# Patient Record
Sex: Male | Born: 1980
Health system: Southern US, Community
[De-identification: ages and names within clinical notes are randomized; demographics above are authoritative.]

## PROBLEM LIST (undated history)

## (undated) HISTORY — PX: TONSILLECTOMY: SUR1361

---

## 2000-05-02 ENCOUNTER — Encounter: Payer: Self-pay | Admitting: *Deleted

## 2000-05-02 ENCOUNTER — Emergency Department (HOSPITAL_COMMUNITY): Admission: EM | Admit: 2000-05-02 | Discharge: 2000-05-02 | Payer: Self-pay | Admitting: Emergency Medicine

## 2001-12-30 ENCOUNTER — Encounter: Payer: Self-pay | Admitting: Emergency Medicine

## 2001-12-30 ENCOUNTER — Emergency Department (HOSPITAL_COMMUNITY): Admission: EM | Admit: 2001-12-30 | Discharge: 2001-12-30 | Payer: Self-pay | Admitting: Emergency Medicine

## 2010-07-29 ENCOUNTER — Emergency Department (HOSPITAL_BASED_OUTPATIENT_CLINIC_OR_DEPARTMENT_OTHER): Admission: EM | Admit: 2010-07-29 | Discharge: 2010-07-29 | Payer: Self-pay | Admitting: Emergency Medicine

## 2012-06-23 ENCOUNTER — Encounter (HOSPITAL_BASED_OUTPATIENT_CLINIC_OR_DEPARTMENT_OTHER): Payer: Self-pay

## 2012-06-23 ENCOUNTER — Emergency Department (HOSPITAL_BASED_OUTPATIENT_CLINIC_OR_DEPARTMENT_OTHER)
Admission: EM | Admit: 2012-06-23 | Discharge: 2012-06-23 | Disposition: A | Discharge status: Home / Self Care | Payer: Self-pay | Attending: Emergency Medicine | Admitting: Emergency Medicine

## 2012-06-23 DIAGNOSIS — R05 Cough: Secondary | ICD-10-CM

## 2012-06-23 DIAGNOSIS — R059 Cough, unspecified: Secondary | ICD-10-CM | POA: Insufficient documentation

## 2012-06-23 DIAGNOSIS — L259 Unspecified contact dermatitis, unspecified cause: Secondary | ICD-10-CM | POA: Insufficient documentation

## 2012-06-23 DIAGNOSIS — Z91038 Other insect allergy status: Secondary | ICD-10-CM | POA: Insufficient documentation

## 2012-06-23 DIAGNOSIS — L309 Dermatitis, unspecified: Secondary | ICD-10-CM

## 2012-06-23 DIAGNOSIS — L0100 Impetigo, unspecified: Secondary | ICD-10-CM | POA: Insufficient documentation

## 2012-06-23 DIAGNOSIS — Z880 Allergy status to penicillin: Secondary | ICD-10-CM | POA: Insufficient documentation

## 2012-06-23 MED ORDER — BENZONATATE 100 MG PO CAPS: 100.0000 mg | ORAL_CAPSULE | Freq: Three times a day (TID) | ORAL | Status: AC

## 2012-06-23 MED ORDER — MUPIROCIN CALCIUM 2 % EX CREA: TOPICAL_CREAM | Freq: Three times a day (TID) | CUTANEOUS | Status: AC

## 2012-06-23 MED ORDER — HYDROCORTISONE 1 % EX CREA: TOPICAL_CREAM | CUTANEOUS | Status: AC
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
## 2012-06-27 NOTE — ED Provider Notes (Signed)
Medical screening examination/treatment/procedure(s) were performed by non-physician practitioner and as supervising physician I was immediately available for consultation/collaboration.   Demarco Bacci B. Bernette Mayers, MD 06/27/12 [phone]

## 2013-07-23 ENCOUNTER — Encounter (HOSPITAL_BASED_OUTPATIENT_CLINIC_OR_DEPARTMENT_OTHER): Payer: Self-pay | Admitting: Emergency Medicine

## 2013-07-23 ENCOUNTER — Emergency Department (HOSPITAL_BASED_OUTPATIENT_CLINIC_OR_DEPARTMENT_OTHER): Payer: Self-pay

## 2013-07-23 ENCOUNTER — Emergency Department (HOSPITAL_BASED_OUTPATIENT_CLINIC_OR_DEPARTMENT_OTHER)
Admission: EM | Admit: 2013-07-23 | Discharge: 2013-07-23 | Disposition: A | Discharge status: Home / Self Care | Payer: Self-pay | Attending: Emergency Medicine | Admitting: Emergency Medicine

## 2013-07-23 DIAGNOSIS — S6990XA Unspecified injury of unspecified wrist, hand and finger(s), initial encounter: Secondary | ICD-10-CM | POA: Insufficient documentation

## 2013-07-23 DIAGNOSIS — IMO0002 Reserved for concepts with insufficient information to code with codable children: Secondary | ICD-10-CM | POA: Insufficient documentation

## 2013-07-23 DIAGNOSIS — Y9389 Activity, other specified: Secondary | ICD-10-CM | POA: Insufficient documentation

## 2013-07-23 DIAGNOSIS — Z880 Allergy status to penicillin: Secondary | ICD-10-CM | POA: Insufficient documentation

## 2013-07-23 DIAGNOSIS — Y9289 Other specified places as the place of occurrence of the external cause: Secondary | ICD-10-CM | POA: Insufficient documentation

## 2013-07-23 DIAGNOSIS — S6980XA Other specified injuries of unspecified wrist, hand and finger(s), initial encounter: Secondary | ICD-10-CM | POA: Insufficient documentation

## 2013-07-23 DIAGNOSIS — S6991XA Unspecified injury of right wrist, hand and finger(s), initial encounter: Secondary | ICD-10-CM

## 2013-07-23 DIAGNOSIS — X58XXXA Exposure to other specified factors, initial encounter: Secondary | ICD-10-CM | POA: Insufficient documentation

## 2013-07-23 NOTE — ED Notes (Signed)
Pt was on a deck when boards broke and fell through x 2 days ago. Pt c/o pain and swelling in right wrist.

## 2013-07-23 NOTE — ED Provider Notes (Signed)
CSN: [id number]     Arrival date & time 07/23/13  2010 History  This chart was scribed for Candyce Churn, MD by Karle Plumber, ED Scribe. This patient was seen in room MH03/MH03 and the patient's care was started at 10:00 PM.    Chief Complaint  Patient presents with  . Arm Injury   Patient is a 32 y.o. male presenting with arm injury. The history is provided by the patient. No language interpreter was used.  Arm Injury Location:  Wrist Time since incident:  2 days Wrist location:  R wrist  HPI Comments:  Rodney Hunter is a 32 y.o. male who presents to the Emergency Department complaining of a right wrist injury after falling about three feet through the boards of a deck three days ago. Pt states he has a sharp shooting pain from the base of his right thumb up into his arm. He states he has taken hydrocodone for pain at night with moderate relief and 3 Ibuprofen approximately twice during the day. Pt denies h/o all other pertinent past medical history. He states he does not have a primary care physician.   History reviewed. No pertinent past medical history. Past Surgical History  Procedure Laterality Date  . Tonsillectomy     No family history on file. History  Substance Use Topics  . Smoking status: Never Smoker   . Smokeless tobacco: Not on file  . Alcohol Use: Yes    Review of Systems  All other systems reviewed and are negative.    Allergies  Bee venom and Penicillins  Home Medications  No current outpatient prescriptions on file. Triage Vitals: BP 117/65  Pulse 64  Temp(Src) 98.2 F (36.8 C) (Oral)  Resp 16  Ht 5\' 6"  (1.676 m)  Wt 148 lb (67.132 kg)  BMI 23.9 kg/m2  SpO2 99% Physical Exam  Nursing note and vitals reviewed. Constitutional: He is oriented to person, place, and time. He appears well-developed and well-nourished. No distress.  HENT:  Head: Normocephalic and atraumatic.  Eyes: Conjunctivae are normal. No scleral icterus.  Neck: Neck  supple.  Cardiovascular: Normal rate and intact distal pulses.   Pulmonary/Chest: Effort normal. No stridor. No respiratory distress.  Abdominal: Normal appearance. He exhibits no distension.  Musculoskeletal:       Right wrist: He exhibits tenderness and bony tenderness (thumb metacarpal. no snuffbox tenderness). He exhibits normal range of motion (normal tendon function), no swelling, no effusion, no crepitus, no deformity and no laceration.  Neurological: He is alert and oriented to person, place, and time.  Skin: Skin is warm and dry. No rash noted.  Psychiatric: He has a normal mood and affect. His behavior is normal.    ED Course  Procedures (including critical care time) DIAGNOSTIC STUDIES: Oxygen Saturation is 99% on RA, normal by my interpretation.   COORDINATION OF CARE: 10:07 PM- Advised pt to continue taking ibuprofen for pain and a splint would be applied. Pt verbalizes understanding and agrees to plan.  Medications - No data to display  Labs Review Labs Reviewed - No data to display Imaging Review Dg Wrist Complete Right  07/23/2013   CLINICAL DATA:  Right radial wrist pain.  Fall.  EXAM: RIGHT WRIST - COMPLETE 3+ VIEW  COMPARISON:  None  FINDINGS: There is no evidence of fracture or dislocation. There is no evidence of arthropathy or other focal bone abnormality. Soft tissues are unremarkable.  IMPRESSION: Negative.   Electronically Signed   By: Charlett Nose M.D.  On: 07/23/2013 20:49  All radiology studies independently viewed by me.     MDM   1. Thumb injury, right, initial encounter    32 yo male with injury to right hand three days ago.  Plain films negative.  Splint placed for comfort.  No snuffbox tenderness.  Advised NSAIDs, Ice, rest.    I personally performed the services described in this documentation, which was scribed in my presence. The recorded information has been reviewed and is accurate.    Candyce Churn, MD 07/23/13 [phone]

## 2013-07-23 NOTE — ED Notes (Signed)
Went to discharge pt and he had already left

## 2013-07-23 NOTE — ED Notes (Signed)
Patient is ready for discharge

## 2016-10-28 ENCOUNTER — Encounter (HOSPITAL_COMMUNITY): Payer: Self-pay | Admitting: Emergency Medicine

## 2016-10-28 ENCOUNTER — Emergency Department (HOSPITAL_COMMUNITY)
Admission: EM | Admit: 2016-10-28 | Discharge: 2016-10-28 | Disposition: A | Discharge status: Home / Self Care | Payer: Self-pay | Attending: Emergency Medicine | Admitting: Emergency Medicine

## 2016-10-28 ENCOUNTER — Emergency Department (HOSPITAL_COMMUNITY): Payer: Self-pay

## 2016-10-28 DIAGNOSIS — S43005A Unspecified dislocation of left shoulder joint, initial encounter: Secondary | ICD-10-CM

## 2016-10-28 DIAGNOSIS — Y999 Unspecified external cause status: Secondary | ICD-10-CM | POA: Insufficient documentation

## 2016-10-28 DIAGNOSIS — Y929 Unspecified place or not applicable: Secondary | ICD-10-CM | POA: Insufficient documentation

## 2016-10-28 DIAGNOSIS — S0091XA Abrasion of unspecified part of head, initial encounter: Secondary | ICD-10-CM | POA: Insufficient documentation

## 2016-10-28 DIAGNOSIS — W01110A Fall on same level from slipping, tripping and stumbling with subsequent striking against sharp glass, initial encounter: Secondary | ICD-10-CM | POA: Insufficient documentation

## 2016-10-28 DIAGNOSIS — Z79899 Other long term (current) drug therapy: Secondary | ICD-10-CM | POA: Insufficient documentation

## 2016-10-28 DIAGNOSIS — Y9389 Activity, other specified: Secondary | ICD-10-CM | POA: Insufficient documentation

## 2016-10-28 DIAGNOSIS — S43015A Anterior dislocation of left humerus, initial encounter: Secondary | ICD-10-CM | POA: Insufficient documentation

## 2016-10-28 MED ORDER — ONDANSETRON HCL 4 MG/2ML IJ SOLN
4.0000 mg | Freq: Once | INTRAMUSCULAR | Status: AC
  Administered 2016-10-28: 4 mg via INTRAVENOUS
  Filled 2016-10-28: qty 2

## 2016-10-28 MED ORDER — HYDROMORPHONE HCL 2 MG/ML IJ SOLN
1.0000 mg | Freq: Once | INTRAMUSCULAR | Status: AC
  Administered 2016-10-28: 1 mg via INTRAVENOUS
  Filled 2016-10-28: qty 1

## 2016-10-28 MED ORDER — HYDROCODONE-ACETAMINOPHEN 5-325 MG PO TABS: 1.0000 | ORAL_TABLET | Freq: Four times a day (QID) | ORAL | 0 refills | Status: DC | PRN

## 2016-10-28 MED ORDER — KETOROLAC TROMETHAMINE 15 MG/ML IJ SOLN
15.0000 mg | Freq: Once | INTRAMUSCULAR | Status: AC
  Administered 2016-10-28: 15 mg via INTRAVENOUS
  Filled 2016-10-28: qty 1
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
## 2016-10-28 NOTE — Discharge Instructions (Signed)
Read the information below.  Your shoulder was reduced in the ED. You are being provided an arm sling. Please use until re-evaluated by orthopedics. You can ice for 20 minute increments. You can take motrin 400mg  every 6hrs for mild to moderate pain. I have prescribed vicodin for severe pain.  I have provided the contact information for orthopedics. Please call to follow up for re-evaluation.  Use the prescribed medication as directed.  Please discuss all new medications with your pharmacist.   You may return to the Emergency Department at any time for worsening condition or any new symptoms that concern you.
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
## 2016-10-28 NOTE — ED Provider Notes (Signed)
[facility]-EMERGENCY DEPT Provider Note   CSN: [id number] Arrival date & time: 10/28/16  1413     History   Chief Complaint Chief Complaint  Patient presents with  . Shoulder Injury  . Abrasion    HPI Rodney Hunter is a 35 y.o. male.  Rodney Hunter is a 35 y.o. male with no pertinent medical history presents to ED with complaint of left shoulder pain onset today. Patient reports he was playing around this afternoon with he fell and landed on his left shoulder. He complains of left shoulder pain and deformity. He also reports a superficial abrasion to the top of his head which he attributes to hitting his head on a door. He denies LOC. No anti-coagulation therapy. He did report some associated nausea and lightheadedness initially after the insult; however, this has since resolved. No treatments tried PTA. No other complaints at this time.       History reviewed. No pertinent past medical history.  There are no active problems to display for this patient.   Past Surgical History:  Procedure Laterality Date  . TONSILLECTOMY         Home Medications    Prior to Admission medications   Medication Sig Start Date End Date Taking? Authorizing Provider  ibuprofen (ADVIL,MOTRIN) 200 MG tablet Take 400 mg by mouth every 6 (six) hours as needed.   Yes Historical Provider, MD  HYDROcodone-acetaminophen (NORCO/VICODIN) 5-325 MG tablet Take 1-2 tablets by mouth every 6 (six) hours as needed. 10/28/16   Lona KettleAshley Laurel Meyer, PA-C    Family History No family history on file.  Social History Social History  Substance Use Topics  . Smoking status: Never Smoker  . Smokeless tobacco: Not on file  . Alcohol use Yes     Allergies   Bee venom and Penicillins   Review of Systems Review of Systems  Constitutional: Negative for fever.  HENT: Negative for trouble swallowing.   Eyes: Negative for visual disturbance.  Respiratory: Negative for shortness of breath.     Cardiovascular: Negative for chest pain.  Gastrointestinal: Positive for nausea ( resolved). Negative for abdominal pain and vomiting.  Genitourinary: Negative for dysuria and hematuria.  Musculoskeletal: Positive for arthralgias. Negative for myalgias.  Skin: Positive for wound.  Neurological: Negative for dizziness, syncope, light-headedness, numbness and headaches.     Physical Exam Updated Vital Signs BP 130/93   Pulse 94   Temp 98.6 F (37 C)   Resp 18   Ht 5\' 6"  (1.676 m)   Wt 67.6 kg   SpO2 98%   BMI 24.05 kg/m   Physical Exam  Constitutional: He appears well-developed and well-nourished. No distress.  HENT:  Head: Normocephalic and atraumatic. Head is without raccoon's eyes and without Battle's sign.  Mouth/Throat: Oropharynx is clear and moist. No oropharyngeal exudate.  Eyes: Conjunctivae and EOM are normal. Pupils are equal, round, and reactive to light. Right eye exhibits no discharge. Left eye exhibits no discharge. No scleral icterus.  Neck: Normal range of motion and phonation normal. Neck supple. No neck rigidity. Normal range of motion present.  Cardiovascular: Normal rate, regular rhythm, normal heart sounds and intact distal pulses.   No murmur heard. Pulmonary/Chest: Effort normal and breath sounds normal. No stridor. No respiratory distress. He has no wheezes. He has no rales.  Abdominal: Soft. Bowel sounds are normal. He exhibits no distension. There is no tenderness. There is no rigidity, no rebound, no guarding and no CVA tenderness.  Musculoskeletal: Normal range of motion.  Left shoulder: He exhibits deformity.  Obvious deformity of left shoulder. Patient able to move fingers. Sensation grossly intact. 2+ radial pulse.   Lymphadenopathy:    He has no cervical adenopathy.  Neurological: He is alert. He is not disoriented. No cranial nerve deficit or sensory deficit. He exhibits normal muscle tone. Coordination and gait normal. GCS eye subscore is  4. GCS verbal subscore is 5. GCS motor subscore is 6.  Mental Status: Alert, thought content appropriate, able to give a coherent history. Speech fluent without evidence of aphasia.  CN 2-12 grossly intact. Sensation grossly equal and symmetric. ROM and strength assessment deferred due to left shoulder dislocation. Pt ambulatory with steady gait.  Skin: Skin is warm and dry. He is not diaphoretic.     Psychiatric: He has a normal mood and affect. His behavior is normal.     ED Treatments / Results  Labs (all labs ordered are listed, but only abnormal results are displayed) Labs Reviewed - No data to display  EKG  EKG Interpretation None       Radiology Dg Shoulder Left  Result Date: 10/28/2016 CLINICAL DATA:  Status post reduction of left shoulder dislocation today. EXAM: LEFT SHOULDER - 2+ VIEW COMPARISON:  Plain films left shoulder earlier today. FINDINGS: Anterior dislocation has been reduced. No fracture is identified. The acromioclavicular joint is intact. IMPRESSION: Successful reduction of dislocation.  No acute abnormality. Electronically Signed   By: Drusilla Kannerhomas  Dalessio M.D.   On: 10/28/2016 18:32   Dg Shoulder Left  Result Date: 10/28/2016 CLINICAL DATA:  Left shoulder injury EXAM: LEFT SHOULDER - 2+ VIEW COMPARISON:  None. FINDINGS: There is anterior dislocation at the left glenohumeral joint. No acute fracture is visualized. The left acromioclavicular joint remains approximated. IMPRESSION: Anterior dislocation of the left glenohumeral joint without visible fracture. Electronically Signed   By: Deatra RobinsonKevin  Herman M.D.   On: 10/28/2016 15:31    Procedures Procedures (including critical care time)  Medications Ordered in ED Medications  HYDROmorphone (DILAUDID) injection 1 mg (1 mg Intravenous Given 10/28/16 1648)  ondansetron (ZOFRAN) injection 4 mg (4 mg Intravenous Given 10/28/16 1648)  ketorolac (TORADOL) 15 MG/ML injection 15 mg (15 mg Intravenous Given 10/28/16 1724)       Initial Impression / Assessment and Plan / ED Course  I have reviewed the triage vital signs and the nursing notes.  Pertinent labs & imaging results that were available during my care of the patient were reviewed by me and considered in my medical decision making (see chart for details).  Clinical Course as of Oct 28 1853  Mon Oct 28, 2016  1612 DG Shoulder Left [AM]  1823 DG Shoulder Left [AM]    Clinical Course User Index [AM] Lona KettleAshley Laurel Meyer, PA-C    Patient presents to ED with complaint of left shoulder pain s/p fall. No LOC. No anti-coagulation therapy. Patient is afebrile and non-toxic appearing in NAD. VSS. Obvious deformity of left shoulder. Patient able to move fingers. Sensation grossly intact. 2+ radial pulses. X-ray remarkable for anterior dislocation of glenohumeral joint. Superficial abrasion to crown of head. No battle sign or raccoon eyes. Based on [place] head CT do not feel imaging of head is warranted at this time. Discussed pt with Dr. Madilyn Hookees who also evaluated patient.   Dr. Madilyn Hookees reduced left shoulder. Repeat x-ray shows successful reduction. Radial pulse 2+, sensation intact, able to move fingers, strong grip strength on re-assessment. Pt placed in arm sling. Rx pain medication. Review of [place]  controlled substance database shows no recent rx for narcotics. Work note. Follow up with ortho. Return precautions given. Pt voiced understanding and is agreeable.   Final Clinical Impressions(s) / ED Diagnoses   Final diagnoses:  Dislocation of left shoulder joint, initial encounter    New Prescriptions New Prescriptions   HYDROCODONE-ACETAMINOPHEN (NORCO/VICODIN) 5-325 MG TABLET    Take 1-2 tablets by mouth every 6 (six) hours as needed.     Lona Kettle, PA-C 10/28/16 1854    Tilden Fossa, MD 10/30/16 [phone]

## 2016-10-28 NOTE — Progress Notes (Signed)
Entered in d/c instructions please use the resources provided to you in ED to assist with finding a provider for follow up care 

## 2016-10-28 NOTE — ED Notes (Signed)
Pt will not allow RN to place Ice on injured shoulder

## 2016-10-28 NOTE — ED Notes (Signed)
Patient d/c'd self care.  F/U and medications reviewed.  Patient verbalized understanding. 

## 2016-10-28 NOTE — ED Notes (Signed)
Bed: WA24 Expected date:  Expected time:  Means of arrival:  Comments: Triage 2 

## 2018-03-26 ENCOUNTER — Encounter (HOSPITAL_COMMUNITY): Payer: Self-pay | Admitting: Emergency Medicine

## 2018-03-26 ENCOUNTER — Other Ambulatory Visit: Payer: Self-pay

## 2018-03-26 ENCOUNTER — Emergency Department (HOSPITAL_COMMUNITY)
Admission: EM | Admit: 2018-03-26 | Discharge: 2018-03-27 | Disposition: A | Discharge status: Home / Self Care | Payer: Self-pay | Attending: Emergency Medicine | Admitting: Emergency Medicine

## 2018-03-26 DIAGNOSIS — Z5321 Procedure and treatment not carried out due to patient leaving prior to being seen by health care provider: Secondary | ICD-10-CM | POA: Insufficient documentation

## 2018-03-26 DIAGNOSIS — M545 Low back pain: Secondary | ICD-10-CM | POA: Insufficient documentation
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
## 2018-03-27 ENCOUNTER — Emergency Department (HOSPITAL_BASED_OUTPATIENT_CLINIC_OR_DEPARTMENT_OTHER)
Admission: EM | Admit: 2018-03-27 | Discharge: 2018-03-27 | Disposition: A | Discharge status: Home / Self Care | Payer: No Typology Code available for payment source | Attending: Emergency Medicine | Admitting: Emergency Medicine

## 2018-03-27 ENCOUNTER — Encounter (HOSPITAL_BASED_OUTPATIENT_CLINIC_OR_DEPARTMENT_OTHER): Payer: Self-pay | Admitting: Emergency Medicine

## 2018-03-27 ENCOUNTER — Emergency Department (HOSPITAL_BASED_OUTPATIENT_CLINIC_OR_DEPARTMENT_OTHER): Payer: No Typology Code available for payment source

## 2018-03-27 ENCOUNTER — Other Ambulatory Visit: Payer: Self-pay

## 2018-03-27 DIAGNOSIS — Y999 Unspecified external cause status: Secondary | ICD-10-CM | POA: Insufficient documentation

## 2018-03-27 DIAGNOSIS — M545 Low back pain, unspecified: Secondary | ICD-10-CM

## 2018-03-27 DIAGNOSIS — M62838 Other muscle spasm: Secondary | ICD-10-CM | POA: Diagnosis not present

## 2018-03-27 DIAGNOSIS — Y9389 Activity, other specified: Secondary | ICD-10-CM | POA: Diagnosis not present

## 2018-03-27 DIAGNOSIS — S3992XA Unspecified injury of lower back, initial encounter: Secondary | ICD-10-CM | POA: Diagnosis present

## 2018-03-27 DIAGNOSIS — Y92410 Unspecified street and highway as the place of occurrence of the external cause: Secondary | ICD-10-CM | POA: Diagnosis not present

## 2018-03-27 MED ORDER — CYCLOBENZAPRINE HCL 10 MG PO TABS: 10.0000 mg | ORAL_TABLET | Freq: Every evening | ORAL | 0 refills | Status: DC | PRN

## 2018-03-27 MED FILL — CYCLOBENZAPRINE HCL 10 MG T: 10 | 10 days supply | Qty: 10 | Fill #0

## 2018-03-27 NOTE — ED Notes (Signed)
Pt positioned for comfort, explained possible care being provided

## 2018-03-27 NOTE — ED Notes (Signed)
Pt not present in lobby. Pt left without notifying staff

## 2018-03-27 NOTE — ED Notes (Signed)
ED Provider at bedside. 
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
## 2018-03-27 NOTE — ED Notes (Signed)
No discoloration, bruising noted on thoracic area
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
## 2018-03-27 NOTE — ED Notes (Signed)
DC instructions reviewed with pt, discussed safety while taking PO muscle relaxants, also use of hot shower to aid in comfort. Work note also provided. Opportunity for questions provided. Teach Back Method Used

## 2018-03-27 NOTE — Discharge Instructions (Signed)

## 2018-03-27 NOTE — ED Notes (Signed)
X-rays as ordered completed, pt ready for re-evaluation

## 2018-06-23 ENCOUNTER — Encounter (HOSPITAL_BASED_OUTPATIENT_CLINIC_OR_DEPARTMENT_OTHER): Payer: Self-pay | Admitting: *Deleted

## 2018-06-23 ENCOUNTER — Other Ambulatory Visit: Payer: Self-pay

## 2018-06-23 ENCOUNTER — Emergency Department (HOSPITAL_BASED_OUTPATIENT_CLINIC_OR_DEPARTMENT_OTHER)
Admission: EM | Admit: 2018-06-23 | Discharge: 2018-06-23 | Disposition: A | Discharge status: Home / Self Care | Payer: Self-pay | Attending: Emergency Medicine | Admitting: Emergency Medicine

## 2018-06-23 DIAGNOSIS — Z202 Contact with and (suspected) exposure to infections with a predominantly sexual mode of transmission: Secondary | ICD-10-CM | POA: Insufficient documentation

## 2018-06-23 LAB — URINALYSIS, MICROSCOPIC (REFLEX)

## 2018-06-23 LAB — URINALYSIS, ROUTINE W REFLEX MICROSCOPIC
Bilirubin Urine: NEGATIVE
Glucose, UA: NEGATIVE mg/dL
Ketones, ur: NEGATIVE mg/dL
Leukocytes, UA: NEGATIVE
NITRITE: NEGATIVE
Protein, ur: NEGATIVE mg/dL
Specific Gravity, Urine: 1.01 (ref 1.005–1.030)
pH: 6.5 (ref 5.0–8.0)
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
## 2018-06-23 NOTE — ED Triage Notes (Signed)
Std exposure. No symptoms.

## 2018-06-24 LAB — GC/CHLAMYDIA PROBE AMP (~~LOC~~) NOT AT ARMC
Chlamydia: NEGATIVE
NEISSERIA GONORRHEA: NEGATIVE

## 2018-07-28 ENCOUNTER — Emergency Department (HOSPITAL_BASED_OUTPATIENT_CLINIC_OR_DEPARTMENT_OTHER)
Admission: EM | Admit: 2018-07-28 | Discharge: 2018-07-28 | Disposition: A | Discharge status: Home / Self Care | Payer: No Typology Code available for payment source | Attending: Emergency Medicine | Admitting: Emergency Medicine

## 2018-07-28 ENCOUNTER — Encounter (HOSPITAL_BASED_OUTPATIENT_CLINIC_OR_DEPARTMENT_OTHER): Payer: Self-pay

## 2018-07-28 ENCOUNTER — Emergency Department (HOSPITAL_BASED_OUTPATIENT_CLINIC_OR_DEPARTMENT_OTHER): Payer: No Typology Code available for payment source

## 2018-07-28 ENCOUNTER — Other Ambulatory Visit: Payer: Self-pay

## 2018-07-28 DIAGNOSIS — M79605 Pain in left leg: Secondary | ICD-10-CM | POA: Insufficient documentation

## 2018-07-28 DIAGNOSIS — M25572 Pain in left ankle and joints of left foot: Secondary | ICD-10-CM | POA: Diagnosis not present

## 2018-07-28 DIAGNOSIS — M545 Low back pain: Secondary | ICD-10-CM | POA: Insufficient documentation

## 2018-07-28 MED ORDER — ACETAMINOPHEN 500 MG PO TABS: 500.0000 mg | ORAL_TABLET | Freq: Four times a day (QID) | ORAL | 0 refills | Status: DC | PRN

## 2018-07-28 MED ORDER — NAPROXEN 500 MG PO TABS: 500.0000 mg | ORAL_TABLET | Freq: Two times a day (BID) | ORAL | 0 refills | Status: AC

## 2018-07-28 MED ORDER — METHOCARBAMOL 500 MG PO TABS: 500.0000 mg | ORAL_TABLET | Freq: Two times a day (BID) | ORAL | 0 refills | Status: AC
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
## 2018-07-28 NOTE — ED Notes (Signed)
PA at the bedside.

## 2018-07-28 NOTE — ED Notes (Signed)
ED Provider at bedside. 

## 2018-07-28 NOTE — ED Notes (Signed)
Patient transported to X-ray 
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
## 2020-09-22 ENCOUNTER — Ambulatory Visit: Payer: Self-pay

## 2020-09-22 NOTE — Telephone Encounter (Signed)
Returned call to pt.  Reported he has a "lump or knot" close to the anus.  Stated this just appeared today.  Reported he had a pimple in the same area about one month ago, and he pressed very hard to pop the pimple.  Stated there was some bleeding and soreness at first, but it subsided.  Now has noticed this lump in the same area.  Reported in looking at the area, it appears to be a little smaller than a pencil eraser.  Stated there is no pain.  The area feels "hard".  Stated "it looks dark like a bruise".  Denied any drainage from the lump.  Denied any difficulty having a BM.  Denied fever/ chills.  Pt. Does not have a PCP.  Directed him to apply warm compresses to the area 2-3 times/ day for 10-15 min. Intervals.  Advised NOT to try and express anything from the lump.  Advised to monitor for any worsening tenderness, inflammation, swelling, fever/ chills, and to go to UC for eval, if the sx's worsen.  Encouraged pt. To establish care with a PCP.  Transferred to [facility] to schedule a New patient appt.  Pt. Verb. Understanding.  Agreed with plan.   Reason for Disposition  [1] Small swelling or lump AND [2] unexplained AND [3] present < 1 week  Answer Assessment - Initial Assessment Questions 1. APPEARANCE of SWELLING: "What does it look like?" (e.g., lymph node, insect bite, mole)     Discolored bump close to anus 2. SIZE: "How large is the swelling?" (inches, cm or compare to coins)     Approx. A little smaller than pencil eraser 3. LOCATION: "Where is the swelling located?"     Close to the anus 4. ONSET: "When did the swelling start?"     today 5. PAIN: "Is it painful?" If Yes, ask: "How much?"     Denied pain 6. ITCH: "Does it itch?" If Yes, ask: "How much?"     Denied  7. CAUSE: "What do you think caused the swelling?"     Unknown; had a pimple there approx. 1 mo. Ago that he popped.  8. OTHER SYMPTOMS: "Do you have any other symptoms?" (e.g., fever)     Some bruising  discoloration; area feels firm/ hard; denied any drainage.  Protocols used: SKIN LUMP OR LOCALIZED SWELLING-A-AH  Message from Geronimo Boot sent at 09/22/2020 2:27 PM EST  Summary: Clinical Advice   Patient would like to speak wit nurse in regards to blister on buttock. Please advise

## 2020-09-23 ENCOUNTER — Emergency Department (HOSPITAL_BASED_OUTPATIENT_CLINIC_OR_DEPARTMENT_OTHER)
Admission: EM | Admit: 2020-09-23 | Discharge: 2020-09-23 | Disposition: A | Discharge status: Home / Self Care | Payer: Self-pay | Attending: Emergency Medicine | Admitting: Emergency Medicine

## 2020-09-23 ENCOUNTER — Encounter (HOSPITAL_BASED_OUTPATIENT_CLINIC_OR_DEPARTMENT_OTHER): Payer: Self-pay

## 2020-09-23 ENCOUNTER — Other Ambulatory Visit: Payer: Self-pay

## 2020-09-23 DIAGNOSIS — K649 Unspecified hemorrhoids: Secondary | ICD-10-CM | POA: Insufficient documentation

## 2020-09-23 NOTE — ED Triage Notes (Signed)
Pt reports bump near rectum states that he noticed it yesterday.

## 2020-09-23 NOTE — Discharge Instructions (Signed)
Keep stools soft (use miralax and metamucil as needed). Limit time spent sitting on the commode to less than 3 minutes.  Follow up with GI for any concerns.

## 2020-09-23 NOTE — ED Provider Notes (Signed)
[facility] EMERGENCY DEPARTMENT Provider Note   CSN: [id number] Arrival date & time: 09/23/20  1227     History Chief Complaint  Patient presents with  . Abscess    Rodney Hunter is a 39 y.o. male.  39 year old male presents with complaint of a bump near his rectum.  Patient states that he was experiencing some rectal itching yesterday, on further evaluation of the area he noticed a small bump on the left side of his rectum.  Patient denies constipation, pain with bowel movements, purulent drainage, rectal pain.  Patient denies any recent sexual encounters or trauma to the area.  No other complaints or concerns.        History reviewed. No pertinent past medical history.  There are no problems to display for this patient.   Past Surgical History:  Procedure Laterality Date  . TONSILLECTOMY         No family history on file.  Social History   Tobacco Use  . Smoking status: Never Smoker  . Smokeless tobacco: Never Used  Substance Use Topics  . Alcohol use: Yes    Comment: weekly  . Drug use: Yes    Types: Marijuana    Home Medications Prior to Admission medications   Medication Sig Start Date End Date Taking? Authorizing Provider  acetaminophen (TYLENOL) 500 MG tablet Take 1 tablet (500 mg total) by mouth every 6 (six) hours as needed. 07/28/18   Law, Waylan Boga, PA-C  ibuprofen (ADVIL,MOTRIN) 200 MG tablet Take 400 mg by mouth every 6 (six) hours as needed.    [provider]  methocarbamol (ROBAXIN) 500 MG tablet Take 1 tablet (500 mg total) by mouth 2 (two) times daily. 07/28/18   Law, Waylan Boga, PA-C  naproxen (NAPROSYN) 500 MG tablet Take 1 tablet (500 mg total) by mouth 2 (two) times daily. 07/28/18   Law, Waylan Boga, PA-C    Allergies    Bee venom and Penicillins  Review of Systems   Review of Systems  Constitutional: Negative for fever.  Gastrointestinal: Negative for anal bleeding and rectal pain.  Genitourinary: Negative  for difficulty urinating.  Musculoskeletal: Negative for back pain.  Skin: Negative for rash and wound.  Allergic/Immunologic: Negative for immunocompromised state.    Physical Exam Updated Vital Signs BP 136/77 (BP Location: Left Arm)   Pulse 70   Temp 98 F (36.7 C) (Oral)   Resp 18   Ht 5\' 7"  (1.702 m)   Wt 63 kg   SpO2 100%   BMI 21.77 kg/m   Physical Exam Vitals and nursing note reviewed. Exam conducted with a chaperone present.  Constitutional:      General: He is not in acute distress.    Appearance: He is well-developed. He is not diaphoretic.  HENT:     Head: Normocephalic and atraumatic.  Pulmonary:     Effort: Pulmonary effort is normal.  Genitourinary:   Skin:    General: Skin is warm and dry.     Findings: No erythema or rash.  Neurological:     Mental Status: He is alert and oriented to person, place, and time.  Psychiatric:        Behavior: Behavior normal.     ED Results / Procedures / Treatments   Labs (all labs ordered are listed, but only abnormal results are displayed) Labs Reviewed - No data to display  EKG None  Radiology No results found.  Procedures Procedures (including critical care time)  Medications Ordered in  ED Medications - No data to display  ED Course  I have reviewed the triage vital signs and the nursing notes.  Pertinent labs & imaging results that were available during my care of the patient were reviewed by me and considered in my medical decision making (see chart for details).  Clinical Course as of Sep 23 1349  Sat Sep 23, 2020  [id number] 39 year old male with concern for a lump near his rectum. Chaperone present for exam, appears to have an early hemorrhoid to the left side of the rectum. Discussed keeping stools soft to avoid constipation, limit time sitting on commode. Recommend follow up with GI for any further concerns, states has sex with men and is generally concerned about area/finding.    [LM]    Clinical  Course User Index [LM] Alden Hipp   MDM Rules/Calculators/A&P                          Final Clinical Impression(s) / ED Diagnoses Final diagnoses:  Hemorrhoids, unspecified hemorrhoid type    Rx / DC Orders ED Discharge Orders    None       Jeannie Fend, PA-C 09/23/20 1350    Jacalyn Lefevre, MD 09/23/20 1355

## 2020-09-23 NOTE — ED Notes (Signed)
Yesterday noted "bump" near rectum, having itching. Tender at site, but no pain, no fevers. No sexual activity.

## 2020-11-14 ENCOUNTER — Ambulatory Visit: Payer: Self-pay | Admitting: Registered Nurse

## 2020-11-15 ENCOUNTER — Encounter: Payer: Self-pay | Admitting: Registered Nurse

## 2023-03-16 ENCOUNTER — Telehealth: Payer: Self-pay

## 2023-03-16 ENCOUNTER — Other Ambulatory Visit: Payer: Self-pay

## 2023-03-16 ENCOUNTER — Emergency Department (HOSPITAL_COMMUNITY)
Admission: EM | Admit: 2023-03-16 | Discharge: 2023-03-16 | Disposition: A | Discharge status: Home / Self Care | Payer: Self-pay | Attending: Student | Admitting: Student

## 2023-03-16 ENCOUNTER — Telehealth (HOSPITAL_COMMUNITY): Payer: Self-pay | Admitting: Student

## 2023-03-16 ENCOUNTER — Emergency Department (HOSPITAL_COMMUNITY): Payer: Self-pay

## 2023-03-16 ENCOUNTER — Encounter (HOSPITAL_COMMUNITY): Payer: Self-pay | Admitting: *Deleted

## 2023-03-16 DIAGNOSIS — N132 Hydronephrosis with renal and ureteral calculous obstruction: Secondary | ICD-10-CM | POA: Insufficient documentation

## 2023-03-16 DIAGNOSIS — N200 Calculus of kidney: Secondary | ICD-10-CM

## 2023-03-16 DIAGNOSIS — E876 Hypokalemia: Secondary | ICD-10-CM | POA: Insufficient documentation

## 2023-03-16 LAB — COMPREHENSIVE METABOLIC PANEL
ALT: 33 U/L (ref 0–44)
AST: 28 U/L (ref 15–41)
Albumin: 4.1 g/dL (ref 3.5–5.0)
Alkaline Phosphatase: 75 U/L (ref 38–126)
Anion gap: 15 (ref 5–15)
BUN: 11 mg/dL (ref 6–20)
CO2: 18 mmol/L — ABNORMAL LOW (ref 22–32)
Calcium: 9.1 mg/dL (ref 8.9–10.3)
Chloride: 102 mmol/L (ref 98–111)
Creatinine, Ser: 0.97 mg/dL (ref 0.61–1.24)
GFR, Estimated: >60 mL/min (ref >60)
Glucose, Bld: 129 mg/dL — ABNORMAL HIGH (ref 70–99)
Potassium: 3 mmol/L — ABNORMAL LOW (ref 3.5–5.1)
Sodium: 135 mmol/L (ref 135–145)
Total Bilirubin: 0.7 mg/dL (ref 0.3–1.2)
Total Protein: 7.7 g/dL (ref 6.5–8.1)

## 2023-03-16 LAB — CBC
HCT: 45.6 % (ref 39.0–52.0)
Hemoglobin: 15.5 g/dL (ref 13.0–17.0)
MCH: 30.9 pg (ref 26.0–34.0)
MCHC: 34 g/dL (ref 30.0–36.0)
MCV: 90.8 fL (ref 80.0–100.0)
Platelets: 280 10*3/uL (ref 150–400)
RBC: 5.02 MIL/uL (ref 4.22–5.81)
RDW: 13.2 % (ref 11.5–15.5)
WBC: 15.6 10*3/uL — ABNORMAL HIGH (ref 4.0–10.5)
nRBC: 0 % (ref 0.0–0.2)

## 2023-03-16 LAB — URINALYSIS, ROUTINE W REFLEX MICROSCOPIC
Bilirubin Urine: NEGATIVE
Glucose, UA: NEGATIVE mg/dL
Ketones, ur: NEGATIVE mg/dL
Leukocytes,Ua: NEGATIVE
Nitrite: NEGATIVE
Protein, ur: 100 mg/dL — AB
RBC / HPF: >50 RBC/hpf (ref 0–5)
Specific Gravity, Urine: 1.026 (ref 1.005–1.030)
pH: 5 (ref 5.0–8.0)

## 2023-03-16 LAB — LIPASE, BLOOD: Lipase: 31 U/L (ref 11–51)

## 2023-03-16 LAB — ETHANOL: Alcohol, Ethyl (B): <10 mg/dL (ref <10)

## 2023-03-16 MED ORDER — TAMSULOSIN HCL 0.4 MG PO CAPS: 0.4000 mg | ORAL_CAPSULE | Freq: Every day | ORAL | 0 refills | Status: AC

## 2023-03-16 MED ORDER — NAPROXEN 375 MG PO TABS: 375.0000 mg | ORAL_TABLET | Freq: Two times a day (BID) | ORAL | 0 refills | Status: AC

## 2023-03-16 MED ORDER — LACTATED RINGERS IV BOLUS
1000.0000 mL | Freq: Once | INTRAVENOUS | Status: AC
  Administered 2023-03-16: 1000 mL via INTRAVENOUS

## 2023-03-16 MED ORDER — ACETAMINOPHEN 500 MG PO TABS: 500.0000 mg | ORAL_TABLET | Freq: Four times a day (QID) | ORAL | 0 refills | Status: AC | PRN

## 2023-03-16 MED ORDER — OXYCODONE HCL 5 MG PO TABS: 5.0000 mg | ORAL_TABLET | ORAL | 0 refills | Status: DC | PRN

## 2023-03-16 MED ORDER — KETOROLAC TROMETHAMINE 15 MG/ML IJ SOLN: 15.0000 mg | Freq: Once | INTRAMUSCULAR | Status: DC

## 2023-03-16 MED ORDER — ONDANSETRON 4 MG PO TBDP
4.0000 mg | ORAL_TABLET | Freq: Once | ORAL | Status: AC
  Administered 2023-03-16: 4 mg via ORAL
  Filled 2023-03-16: qty 1

## 2023-03-16 MED ORDER — KETOROLAC TROMETHAMINE 15 MG/ML IJ SOLN
15.0000 mg | Freq: Once | INTRAMUSCULAR | Status: AC
  Administered 2023-03-16: 15 mg via INTRAVENOUS
  Filled 2023-03-16: qty 1

## 2023-03-16 MED ORDER — OXYCODONE HCL 5 MG PO TABS: 5.0000 mg | ORAL_TABLET | ORAL | 0 refills | Status: AC | PRN

## 2023-03-16 MED ORDER — NAPROXEN 375 MG PO TABS: 375.0000 mg | ORAL_TABLET | Freq: Two times a day (BID) | ORAL | 0 refills | Status: DC

## 2023-03-16 MED ORDER — LORAZEPAM 2 MG/ML IJ SOLN: 0.5000 mg | Freq: Once | INTRAMUSCULAR | Status: DC

## 2023-03-16 MED ORDER — LACTATED RINGERS IV BOLUS: 1000.0000 mL | Freq: Once | INTRAVENOUS | Status: DC

## 2023-03-16 MED ORDER — MORPHINE SULFATE (PF) 4 MG/ML IV SOLN
4.0000 mg | Freq: Once | INTRAVENOUS | Status: AC
  Administered 2023-03-16: 4 mg via INTRAVENOUS
  Filled 2023-03-16: qty 1

## 2023-03-16 MED ORDER — ACETAMINOPHEN 500 MG PO TABS: 1000.0000 mg | ORAL_TABLET | Freq: Three times a day (TID) | ORAL | 0 refills | Status: AC

## 2023-03-16 MED ORDER — MORPHINE SULFATE (PF) 4 MG/ML IV SOLN: 4.0000 mg | Freq: Once | INTRAVENOUS | Status: DC

## 2023-03-16 MED ORDER — HYDROCODONE-ACETAMINOPHEN 5-325 MG PO TABS
1.0000 | ORAL_TABLET | Freq: Once | ORAL | Status: AC
  Administered 2023-03-16: 1 via ORAL
  Filled 2023-03-16: qty 1

## 2023-03-16 MED ORDER — DROPERIDOL 2.5 MG/ML IJ SOLN: 1.2500 mg | Freq: Once | INTRAMUSCULAR | Status: DC

## 2023-03-16 NOTE — ED Provider Notes (Signed)
[place] EMERGENCY DEPARTMENT AT [facility] Provider Note  CSN: [id number] Arrival date & time: 03/16/23 4098  Chief Complaint(s) Abdominal Pain  HPI Rodney Hunter is a 42 y.o. male with PMH nephrolithiasis, marijuana use, alcohol abuse who presents emergency department for evaluation of abdominal pain and nausea.  Patient states that he was partying last night and had sudden onset right lower quadrant pain with associated nausea.  He has not vomited.  He states that this feels like his previous kidney stone.  He does endorse smoking multiple blunts a day.  Denies chest pain, shortness of breath, headache, fever or other systemic symptoms.  Past Medical History History reviewed. No pertinent past medical history. There are no problems to display for this patient.  Home Medication(s) Prior to Admission medications   Medication Sig Start Date End Date Taking? Authorizing Provider  acetaminophen (TYLENOL) 500 MG tablet Take 1 tablet (500 mg total) by mouth every 6 (six) hours as needed. 07/28/18   Law, Waylan Boga, PA-C  ibuprofen (ADVIL,MOTRIN) 200 MG tablet Take 400 mg by mouth every 6 (six) hours as needed.    [provider]  methocarbamol (ROBAXIN) 500 MG tablet Take 1 tablet (500 mg total) by mouth 2 (two) times daily. 07/28/18   Law, Waylan Boga, PA-C  naproxen (NAPROSYN) 500 MG tablet Take 1 tablet (500 mg total) by mouth 2 (two) times daily. 07/28/18   Emi Holes, PA-C                                                                                                                                    Past Surgical History Past Surgical History:  Procedure Laterality Date   TONSILLECTOMY     Family History History reviewed. No pertinent family history.  Social History Social History   Tobacco Use   Smoking status: Never   Smokeless tobacco: Never  Substance Use Topics   Alcohol use: Yes    Comment: weekly   Drug use: Yes    Types: Marijuana    Allergies Bee venom and Penicillins  Review of Systems Review of Systems  Gastrointestinal:  Positive for abdominal pain and nausea.    Physical Exam Vital Signs  I have reviewed the triage vital signs BP 136/78 (BP Location: Right Arm)   Pulse 64   Temp 98.1 F (36.7 C)   Resp 20   Ht 5\' 7"  (1.702 m)   Wt 66.7 kg   SpO2 99%   BMI 23.02 kg/m   Physical Exam Constitutional:      General: He is not in acute distress.    Appearance: Normal appearance.  HENT:     Head: Normocephalic and atraumatic.     Nose: No congestion or rhinorrhea.  Eyes:     General:        Right eye: No discharge.        Left eye: No discharge.  Extraocular Movements: Extraocular movements intact.     Pupils: Pupils are equal, round, and reactive to light.  Cardiovascular:     Rate and Rhythm: Normal rate and regular rhythm.     Heart sounds: No murmur heard. Pulmonary:     Effort: No respiratory distress.     Breath sounds: No wheezing or rales.  Abdominal:     General: There is no distension.     Tenderness: There is abdominal tenderness in the right lower quadrant.  Musculoskeletal:        General: Normal range of motion.     Cervical back: Normal range of motion.  Skin:    General: Skin is warm and dry.  Neurological:     General: No focal deficit present.     Mental Status: He is alert.     ED Results and Treatments Labs (all labs ordered are listed, but only abnormal results are displayed) Labs Reviewed  CBC - Abnormal; Notable for the following components:      Result Value   WBC 15.6 (*)    All other components within normal limits  URINALYSIS, ROUTINE W REFLEX MICROSCOPIC - Abnormal; Notable for the following components:   APPearance HAZY (*)    Hgb urine dipstick LARGE (*)    Protein, ur 100 (*)    Bacteria, UA FEW (*)    All other components within normal limits  LIPASE, BLOOD  COMPREHENSIVE METABOLIC PANEL  ETHANOL                                                                                                                           Radiology No results found.  Pertinent labs & imaging results that were available during my care of the patient were reviewed by me and considered in my medical decision making (see MDM for details).  Medications Ordered in ED Medications  HYDROcodone-acetaminophen (NORCO/VICODIN) 5-325 MG per tablet 1 tablet (has no administration in time range)  ondansetron (ZOFRAN-ODT) disintegrating tablet 4 mg (has no administration in time range)                                                                                                                                     Procedures Procedures  (including critical care time)  Medical Decision Making / ED Course   This patient presents to the ED for concern of abdominal and flank  pain, this involves an extensive number of treatment options, and is a complaint that carries with it a high risk of complications and morbidity.  The differential diagnosis includes nephrolithiasis, appendicitis, nephrolithiasis, diverticulitis, epiploic appendagitis, inflammatory bowel disease, constipation, gastroenteritis,   MDM: Patient seen emergency room for evaluation of right lower quadrant pain nausea and vomiting. Physical exam with tenderness in the right lower quadrant but is otherwise unremarkable.  Laboratory evaluation with a mild hypokalemia to 3.0, bicarb 18, leukocytosis to 15.6 but is otherwise unremarkable.  Urinalysis without evidence of infection.  CT stone study showing a 3 mm right proximal ureteral stone with mild hydro nephrosis.  Patient pain controlled and received fluid resuscitation.  With no evidence of infection and normal creatinine, patient safe for discharge with outpatient urologic follow-up.  Patient discharged with pain control and Flomax   Additional history obtained: -Additional history obtained from friend -External records from outside source  obtained and reviewed including: Chart review including previous notes, labs, imaging, consultation notes   Lab Tests: -I ordered, reviewed, and interpreted labs.   The pertinent results include:   Labs Reviewed  CBC - Abnormal; Notable for the following components:      Result Value   WBC 15.6 (*)    All other components within normal limits  URINALYSIS, ROUTINE W REFLEX MICROSCOPIC - Abnormal; Notable for the following components:   APPearance HAZY (*)    Hgb urine dipstick LARGE (*)    Protein, ur 100 (*)    Bacteria, UA FEW (*)    All other components within normal limits  LIPASE, BLOOD  COMPREHENSIVE METABOLIC PANEL  ETHANOL      Imaging Studies ordered: I ordered imaging studies including CT stone study I independently visualized and interpreted imaging. I agree with the radiologist interpretation   Medicines ordered and prescription drug management: Meds ordered this encounter  Medications   DISCONTD: ketorolac (TORADOL) 15 MG/ML injection 15 mg   DISCONTD: morphine (PF) 4 MG/ML injection 4 mg   DISCONTD: lactated ringers bolus 1,000 mL   DISCONTD: droperidol (INAPSINE) 2.5 MG/ML injection 1.25 mg   DISCONTD: LORazepam (ATIVAN) injection 0.5 mg   DISCONTD: lactated ringers bolus 1,000 mL   HYDROcodone-acetaminophen (NORCO/VICODIN) 5-325 MG per tablet 1 tablet   ondansetron (ZOFRAN-ODT) disintegrating tablet 4 mg    -I have reviewed the patients home medicines and have made adjustments as needed  Critical interventions none    Cardiac Monitoring: The patient was maintained on a cardiac monitor.  I personally viewed and interpreted the cardiac monitored which showed an underlying rhythm of: NSR  Social Determinants of Health:  Factors impacting patients care include: none   Reevaluation: After the interventions noted above, I reevaluated the patient and found that they have :improved  Co morbidities that complicate the patient evaluation History  reviewed. No pertinent past medical history.    Dispostion: I considered admission for this patient, but at this time he does not meet inpatient criteria for admission he is safe for discharge with outpatient follow-up     Final Clinical Impression(s) / ED Diagnoses Final diagnoses:  None     @PCDICTATION @    Calli Bashor, Wyn Forster, MD 03/16/23 1747

## 2023-03-16 NOTE — Telephone Encounter (Signed)
Mr Siefert called in via friend Kandee Keen who is with him [facility] [place] stated they do not have the scripts. Securechat with Dr Posey Rea who will place them now to [facility] on [facility] [facility] in a few minutes, was tied up in trauma cases. Called back [place] at [phone] to let them know Apologized for delay.

## 2023-03-16 NOTE — Telephone Encounter (Signed)
Patient called in, just left hospital and the pharmacy stated to him that they do not have the scripts. This RNCM called the pharmacy to verify the message stated they are closed. Messaged Dr Posey Rea to see if the medications can be sent over to [facility] on [facility]  per patient request.

## 2023-03-16 NOTE — Discharge Instructions (Signed)
For pain:  - Acetaminophen 1000 mg three times daily (every 8 hours) - Naproxen 2 times daily (every 12 hours) - oxycodone for breakthrough pain only 

## 2023-03-16 NOTE — Telephone Encounter (Signed)
Note created to send meds to different pharmacy

## 2023-03-16 NOTE — ED Triage Notes (Signed)
C/p right lower abd pain onset 4 hours ago , c/o nausea and vomiting, states he has had kidney stones in the past. States she started urinating blood.

## 2023-03-16 NOTE — ED Notes (Signed)
Pt refused blood work, MD made aware.
